# Patient Record
Sex: Male | Born: 2000 | Race: White | Hispanic: No | Marital: Single
Health system: Southern US, Community
[De-identification: ages and names within clinical notes are randomized; demographics above are authoritative.]

## PROBLEM LIST (undated history)

## (undated) DIAGNOSIS — F840 Autistic disorder: Secondary | ICD-10-CM

## (undated) DIAGNOSIS — J302 Other seasonal allergic rhinitis: Secondary | ICD-10-CM

## (undated) HISTORY — PX: TYMPANOSTOMY TUBE PLACEMENT: SHX32

---
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
## 2023-10-18 DIAGNOSIS — E663 Overweight: Secondary | ICD-10-CM | POA: Diagnosis not present

## 2023-10-21 DIAGNOSIS — Z0000 Encounter for general adult medical examination without abnormal findings: Secondary | ICD-10-CM | POA: Diagnosis not present

## 2023-10-21 DIAGNOSIS — Z23 Encounter for immunization: Secondary | ICD-10-CM | POA: Diagnosis not present

## 2024-04-01 DIAGNOSIS — F331 Major depressive disorder, recurrent, moderate: Secondary | ICD-10-CM | POA: Diagnosis not present

## 2024-04-01 DIAGNOSIS — F840 Autistic disorder: Secondary | ICD-10-CM | POA: Diagnosis not present

## 2024-04-01 DIAGNOSIS — F411 Generalized anxiety disorder: Secondary | ICD-10-CM | POA: Diagnosis not present

## 2024-04-07 DIAGNOSIS — F840 Autistic disorder: Secondary | ICD-10-CM | POA: Diagnosis not present

## 2024-04-07 DIAGNOSIS — F411 Generalized anxiety disorder: Secondary | ICD-10-CM | POA: Diagnosis not present

## 2024-04-07 DIAGNOSIS — F331 Major depressive disorder, recurrent, moderate: Secondary | ICD-10-CM | POA: Diagnosis not present

## 2024-04-13 DIAGNOSIS — F411 Generalized anxiety disorder: Secondary | ICD-10-CM | POA: Diagnosis not present

## 2024-04-13 DIAGNOSIS — F331 Major depressive disorder, recurrent, moderate: Secondary | ICD-10-CM | POA: Diagnosis not present

## 2024-04-13 DIAGNOSIS — F840 Autistic disorder: Secondary | ICD-10-CM | POA: Diagnosis not present

## 2024-04-21 DIAGNOSIS — F411 Generalized anxiety disorder: Secondary | ICD-10-CM | POA: Diagnosis not present

## 2024-04-21 DIAGNOSIS — F331 Major depressive disorder, recurrent, moderate: Secondary | ICD-10-CM | POA: Diagnosis not present

## 2024-04-21 DIAGNOSIS — F840 Autistic disorder: Secondary | ICD-10-CM | POA: Diagnosis not present

## 2024-04-30 DIAGNOSIS — F840 Autistic disorder: Secondary | ICD-10-CM | POA: Diagnosis not present

## 2024-04-30 DIAGNOSIS — F331 Major depressive disorder, recurrent, moderate: Secondary | ICD-10-CM | POA: Diagnosis not present

## 2024-04-30 DIAGNOSIS — F411 Generalized anxiety disorder: Secondary | ICD-10-CM | POA: Diagnosis not present

## 2024-05-11 DIAGNOSIS — F840 Autistic disorder: Secondary | ICD-10-CM | POA: Diagnosis not present

## 2024-05-11 DIAGNOSIS — F331 Major depressive disorder, recurrent, moderate: Secondary | ICD-10-CM | POA: Diagnosis not present

## 2024-05-11 DIAGNOSIS — F411 Generalized anxiety disorder: Secondary | ICD-10-CM | POA: Diagnosis not present

## 2024-05-20 DIAGNOSIS — F331 Major depressive disorder, recurrent, moderate: Secondary | ICD-10-CM | POA: Diagnosis not present

## 2024-05-20 DIAGNOSIS — F840 Autistic disorder: Secondary | ICD-10-CM | POA: Diagnosis not present

## 2024-05-20 DIAGNOSIS — F411 Generalized anxiety disorder: Secondary | ICD-10-CM | POA: Diagnosis not present

## 2024-06-05 DIAGNOSIS — F411 Generalized anxiety disorder: Secondary | ICD-10-CM | POA: Diagnosis not present

## 2024-06-05 DIAGNOSIS — F840 Autistic disorder: Secondary | ICD-10-CM | POA: Diagnosis not present

## 2024-06-05 DIAGNOSIS — F331 Major depressive disorder, recurrent, moderate: Secondary | ICD-10-CM | POA: Diagnosis not present

## 2024-06-29 DIAGNOSIS — F411 Generalized anxiety disorder: Secondary | ICD-10-CM | POA: Diagnosis not present

## 2024-06-29 DIAGNOSIS — F331 Major depressive disorder, recurrent, moderate: Secondary | ICD-10-CM | POA: Diagnosis not present

## 2024-06-29 DIAGNOSIS — F840 Autistic disorder: Secondary | ICD-10-CM | POA: Diagnosis not present

## 2024-07-09 DIAGNOSIS — F840 Autistic disorder: Secondary | ICD-10-CM | POA: Diagnosis not present

## 2024-07-09 DIAGNOSIS — F331 Major depressive disorder, recurrent, moderate: Secondary | ICD-10-CM | POA: Diagnosis not present

## 2024-07-09 DIAGNOSIS — F411 Generalized anxiety disorder: Secondary | ICD-10-CM | POA: Diagnosis not present

## 2024-07-17 DIAGNOSIS — F331 Major depressive disorder, recurrent, moderate: Secondary | ICD-10-CM | POA: Diagnosis not present

## 2024-07-17 DIAGNOSIS — F411 Generalized anxiety disorder: Secondary | ICD-10-CM | POA: Diagnosis not present

## 2024-07-17 DIAGNOSIS — F840 Autistic disorder: Secondary | ICD-10-CM | POA: Diagnosis not present

## 2024-07-23 DIAGNOSIS — F411 Generalized anxiety disorder: Secondary | ICD-10-CM | POA: Diagnosis not present

## 2024-07-23 DIAGNOSIS — F840 Autistic disorder: Secondary | ICD-10-CM | POA: Diagnosis not present

## 2024-07-23 DIAGNOSIS — F331 Major depressive disorder, recurrent, moderate: Secondary | ICD-10-CM | POA: Diagnosis not present

## 2024-07-31 DIAGNOSIS — F411 Generalized anxiety disorder: Secondary | ICD-10-CM | POA: Diagnosis not present

## 2024-07-31 DIAGNOSIS — F331 Major depressive disorder, recurrent, moderate: Secondary | ICD-10-CM | POA: Diagnosis not present

## 2024-07-31 DIAGNOSIS — F840 Autistic disorder: Secondary | ICD-10-CM | POA: Diagnosis not present

## 2024-08-07 DIAGNOSIS — F411 Generalized anxiety disorder: Secondary | ICD-10-CM | POA: Diagnosis not present

## 2024-08-07 DIAGNOSIS — F331 Major depressive disorder, recurrent, moderate: Secondary | ICD-10-CM | POA: Diagnosis not present

## 2024-08-07 DIAGNOSIS — F840 Autistic disorder: Secondary | ICD-10-CM | POA: Diagnosis not present

## 2024-08-12 DIAGNOSIS — F331 Major depressive disorder, recurrent, moderate: Secondary | ICD-10-CM | POA: Diagnosis not present

## 2024-08-12 DIAGNOSIS — F411 Generalized anxiety disorder: Secondary | ICD-10-CM | POA: Diagnosis not present

## 2024-08-12 DIAGNOSIS — F840 Autistic disorder: Secondary | ICD-10-CM | POA: Diagnosis not present

## 2024-08-20 DIAGNOSIS — F331 Major depressive disorder, recurrent, moderate: Secondary | ICD-10-CM | POA: Diagnosis not present

## 2024-08-20 DIAGNOSIS — F411 Generalized anxiety disorder: Secondary | ICD-10-CM | POA: Diagnosis not present

## 2024-08-20 DIAGNOSIS — F840 Autistic disorder: Secondary | ICD-10-CM | POA: Diagnosis not present

## 2024-09-03 DIAGNOSIS — F411 Generalized anxiety disorder: Secondary | ICD-10-CM | POA: Diagnosis not present

## 2024-09-03 DIAGNOSIS — F331 Major depressive disorder, recurrent, moderate: Secondary | ICD-10-CM | POA: Diagnosis not present

## 2024-09-03 DIAGNOSIS — F840 Autistic disorder: Secondary | ICD-10-CM | POA: Diagnosis not present

## 2024-09-11 DIAGNOSIS — F840 Autistic disorder: Secondary | ICD-10-CM | POA: Diagnosis not present

## 2024-09-11 DIAGNOSIS — F411 Generalized anxiety disorder: Secondary | ICD-10-CM | POA: Diagnosis not present

## 2024-09-11 DIAGNOSIS — F331 Major depressive disorder, recurrent, moderate: Secondary | ICD-10-CM | POA: Diagnosis not present

## 2024-09-17 DIAGNOSIS — F840 Autistic disorder: Secondary | ICD-10-CM | POA: Diagnosis not present

## 2024-09-17 DIAGNOSIS — F411 Generalized anxiety disorder: Secondary | ICD-10-CM | POA: Diagnosis not present

## 2024-09-17 DIAGNOSIS — F331 Major depressive disorder, recurrent, moderate: Secondary | ICD-10-CM | POA: Diagnosis not present

## 2024-09-29 DIAGNOSIS — F331 Major depressive disorder, recurrent, moderate: Secondary | ICD-10-CM | POA: Diagnosis not present

## 2024-09-29 DIAGNOSIS — F411 Generalized anxiety disorder: Secondary | ICD-10-CM | POA: Diagnosis not present

## 2024-09-29 DIAGNOSIS — F840 Autistic disorder: Secondary | ICD-10-CM | POA: Diagnosis not present

## 2024-10-08 DIAGNOSIS — F840 Autistic disorder: Secondary | ICD-10-CM | POA: Diagnosis not present

## 2024-10-08 DIAGNOSIS — F331 Major depressive disorder, recurrent, moderate: Secondary | ICD-10-CM | POA: Diagnosis not present

## 2024-10-08 DIAGNOSIS — F411 Generalized anxiety disorder: Secondary | ICD-10-CM | POA: Diagnosis not present

## 2024-10-14 DIAGNOSIS — F331 Major depressive disorder, recurrent, moderate: Secondary | ICD-10-CM | POA: Diagnosis not present

## 2024-10-14 DIAGNOSIS — F840 Autistic disorder: Secondary | ICD-10-CM | POA: Diagnosis not present

## 2024-10-14 DIAGNOSIS — F411 Generalized anxiety disorder: Secondary | ICD-10-CM | POA: Diagnosis not present

## 2024-10-21 DIAGNOSIS — Z23 Encounter for immunization: Secondary | ICD-10-CM | POA: Diagnosis not present

## 2024-10-21 DIAGNOSIS — Z1159 Encounter for screening for other viral diseases: Secondary | ICD-10-CM | POA: Diagnosis not present

## 2024-10-21 DIAGNOSIS — Z111 Encounter for screening for respiratory tuberculosis: Secondary | ICD-10-CM | POA: Diagnosis not present

## 2024-10-21 DIAGNOSIS — Z0189 Encounter for other specified special examinations: Secondary | ICD-10-CM | POA: Diagnosis not present

## 2024-10-27 DIAGNOSIS — F840 Autistic disorder: Secondary | ICD-10-CM | POA: Diagnosis not present

## 2024-10-27 DIAGNOSIS — F331 Major depressive disorder, recurrent, moderate: Secondary | ICD-10-CM | POA: Diagnosis not present

## 2024-10-27 DIAGNOSIS — F411 Generalized anxiety disorder: Secondary | ICD-10-CM | POA: Diagnosis not present

## 2024-11-04 DIAGNOSIS — F840 Autistic disorder: Secondary | ICD-10-CM | POA: Diagnosis not present

## 2024-11-04 DIAGNOSIS — F331 Major depressive disorder, recurrent, moderate: Secondary | ICD-10-CM | POA: Diagnosis not present

## 2024-11-04 DIAGNOSIS — F411 Generalized anxiety disorder: Secondary | ICD-10-CM | POA: Diagnosis not present
